# Patient Record
Sex: Female | Born: 1995 | Race: Black or African American | Hispanic: No | Marital: Single | Smoking: Never smoker
Health system: Southern US, Community
[De-identification: ages and names within clinical notes are randomized; demographics above are authoritative.]

## PROBLEM LIST (undated history)

## (undated) DIAGNOSIS — J45909 Unspecified asthma, uncomplicated: Secondary | ICD-10-CM

---

## 2016-02-22 ENCOUNTER — Emergency Department (HOSPITAL_COMMUNITY): Payer: Medicaid Other

## 2016-02-22 ENCOUNTER — Encounter (HOSPITAL_COMMUNITY): Payer: Self-pay | Admitting: Emergency Medicine

## 2016-02-22 ENCOUNTER — Emergency Department (HOSPITAL_COMMUNITY)
Admission: EM | Admit: 2016-02-22 | Discharge: 2016-02-22 | Disposition: A | Discharge status: Home / Self Care | Payer: Medicaid Other | Attending: Emergency Medicine | Admitting: Emergency Medicine

## 2016-02-22 DIAGNOSIS — S93601A Unspecified sprain of right foot, initial encounter: Secondary | ICD-10-CM | POA: Diagnosis not present

## 2016-02-22 DIAGNOSIS — Y999 Unspecified external cause status: Secondary | ICD-10-CM | POA: Diagnosis not present

## 2016-02-22 DIAGNOSIS — Z79899 Other long term (current) drug therapy: Secondary | ICD-10-CM | POA: Insufficient documentation

## 2016-02-22 DIAGNOSIS — Y939 Activity, unspecified: Secondary | ICD-10-CM | POA: Insufficient documentation

## 2016-02-22 DIAGNOSIS — S99921A Unspecified injury of right foot, initial encounter: Secondary | ICD-10-CM | POA: Diagnosis present

## 2016-02-22 DIAGNOSIS — Y929 Unspecified place or not applicable: Secondary | ICD-10-CM | POA: Diagnosis not present

## 2016-02-22 DIAGNOSIS — W010XXA Fall on same level from slipping, tripping and stumbling without subsequent striking against object, initial encounter: Secondary | ICD-10-CM | POA: Diagnosis not present

## 2016-02-22 DIAGNOSIS — J45909 Unspecified asthma, uncomplicated: Secondary | ICD-10-CM | POA: Diagnosis not present

## 2016-02-22 HISTORY — DX: Unspecified asthma, uncomplicated: J45.909

## 2016-02-22 MED ORDER — HYDROCODONE-ACETAMINOPHEN 5-325 MG PO TABS: 1.0000 | ORAL_TABLET | ORAL | 0 refills | Status: DC | PRN

## 2016-02-22 MED ORDER — IBUPROFEN 600 MG PO TABS: 600.0000 mg | ORAL_TABLET | Freq: Four times a day (QID) | ORAL | 0 refills | Status: DC | PRN

## 2016-02-22 MED ORDER — HYDROCODONE-ACETAMINOPHEN 5-325 MG PO TABS
1.0000 | ORAL_TABLET | Freq: Once | ORAL | Status: AC
  Administered 2016-02-22: 1 via ORAL
  Filled 2016-02-22: qty 1

## 2016-02-22 NOTE — ED Provider Notes (Signed)
[facility]-EMERGENCY DEPT Provider Note   CSN: [id number] Arrival date & time: 02/22/16  1014     History   Chief Complaint Chief Complaint  Patient presents with  . Fall  . Ankle Pain    HPI Tanya Rich is a 20 y.o. female.  Pt presents to the ED today with right foot and right ankle pain.  The pt slipped yesterday.  The pt said she was able to walk yesterday, but she was not able to walk today.  The pt called EMS this morning when she could not walk.        Past Medical History:  Diagnosis Date  . Asthma     There are no active problems to display for this patient.   History reviewed. No pertinent surgical history.  OB History    No data available       Home Medications    Prior to Admission medications   Medication Sig Start Date End Date Taking? Authorizing Provider  HYDROcodone-acetaminophen (NORCO/VICODIN) 5-325 MG tablet Take 1 tablet by mouth every 4 (four) hours as needed. 02/22/16   Jacalyn LefevreJulie Kaion Tisdale, MD  ibuprofen (ADVIL,MOTRIN) 600 MG tablet Take 1 tablet (600 mg total) by mouth every 6 (six) hours as needed. 02/22/16   Jacalyn LefevreJulie Shaaron Golliday, MD    Family History History reviewed. No pertinent family history.  Social History Social History  Substance Use Topics  . Smoking status: Never Smoker  . Smokeless tobacco: Not on file  . Alcohol use No     Allergies   Patient has no known allergies.   Review of Systems Review of Systems  Musculoskeletal:       Right foot, ankle, knee pain  All other systems reviewed and are negative.    Physical Exam Updated Vital Signs BP 132/66 (BP Location: Right Arm)   Pulse 92   Temp 98.2 F (36.8 C) (Oral)   Resp 16   LMP 02/08/2016 (Approximate)   SpO2 97%   Physical Exam  Constitutional: She is oriented to person, place, and time. She appears well-developed and well-nourished.  HENT:  Head: Normocephalic and atraumatic.  Right Ear: External ear normal.  Left Ear: External ear normal.  Nose: Nose  normal.  Mouth/Throat: Oropharynx is clear and moist.  Eyes: Conjunctivae and EOM are normal. Pupils are equal, round, and reactive to light.  Neck: Normal range of motion. Neck supple.  Cardiovascular: Normal rate, regular rhythm, normal heart sounds and intact distal pulses.   Pulmonary/Chest: Effort normal and breath sounds normal.  Abdominal: Soft. Bowel sounds are normal.  Musculoskeletal:       Feet:  Neurological: She is alert and oriented to person, place, and time.  Skin: Skin is warm.  Psychiatric: She has a normal mood and affect. Her behavior is normal. Judgment and thought content normal.  Nursing note and vitals reviewed.    ED Treatments / Results  Labs (all labs ordered are listed, but only abnormal results are displayed) Labs Reviewed - No data to display  EKG  EKG Interpretation None       Radiology Dg Ankle Complete Right  Result Date: 02/22/2016 CLINICAL DATA:  20 year old female with foot and ankle swelling for the past 2 months. Symptoms began after she slipped while walking an was recently exacerbated by a new fall last night EXAM: RIGHT ANKLE - COMPLETE 3+ VIEW COMPARISON:  Concurrently obtained radiographs of the right knee and foot FINDINGS: There is no evidence of fracture, dislocation, or joint effusion. There is  no evidence of arthropathy or other focal bone abnormality. Soft tissues are unremarkable. IMPRESSION: Negative. Electronically Signed   By: Malachy MoanHeath  McCullough M.D.   On: 02/22/2016 11:17   Dg Knee Complete 4 Views Right  Result Date: 02/22/2016 CLINICAL DATA:  20 year old female with foot and ankle swelling for the past 2 months. Symptoms began after she slipped while walking an was recently exacerbated by a new fall last night. EXAM: RIGHT KNEE - COMPLETE 4+ VIEW COMPARISON:  Concurrently obtained radiographs of the foot and ankle FINDINGS: No evidence of fracture, dislocation, or joint effusion. No evidence of arthropathy or other focal bone  abnormality. Soft tissues are unremarkable. IMPRESSION: Negative. Electronically Signed   By: Malachy MoanHeath  McCullough M.D.   On: 02/22/2016 11:14   Dg Foot Complete Right  Result Date: 02/22/2016 CLINICAL DATA:  20 year old female with foot and ankle swelling for the past 2 months. Symptoms began after she slipped while walking an was recently exacerbated by a new fall last night EXAM: RIGHT FOOT COMPLETE - 3+ VIEW COMPARISON:  Concurrently obtained radiographs of the ankle and knee FINDINGS: There is no evidence of fracture or dislocation. Small os peroneus noted incidentally. There is no evidence of arthropathy or other focal bone abnormality. Soft tissues are unremarkable. IMPRESSION: Negative. Electronically Signed   By: Malachy MoanHeath  McCullough M.D.   On: 02/22/2016 11:18    Procedures Procedures (including critical care time)  Medications Ordered in ED Medications  HYDROcodone-acetaminophen (NORCO/VICODIN) 5-325 MG per tablet 1 tablet (1 tablet Oral Given 02/22/16 1028)     Initial Impression / Assessment and Plan / ED Course  I have reviewed the triage vital signs and the nursing notes.  Pertinent labs & imaging results that were available during my care of the patient were reviewed by me and considered in my medical decision making (see chart for details).  Clinical Course    Pain has improved.  Pt will be placed in a post-op shoe and crutches.  She is given the number of ortho to f/u.   Final Clinical Impressions(s) / ED Diagnoses   Final diagnoses:  Foot sprain, right, initial encounter    New Prescriptions New Prescriptions   HYDROCODONE-ACETAMINOPHEN (NORCO/VICODIN) 5-325 MG TABLET    Take 1 tablet by mouth every 4 (four) hours as needed.   IBUPROFEN (ADVIL,MOTRIN) 600 MG TABLET    Take 1 tablet (600 mg total) by mouth every 6 (six) hours as needed.     Jacalyn LefevreJulie Keiji Melland, MD 02/22/16 [phone]

## 2016-02-22 NOTE — ED Notes (Signed)
ED Provider at bedside. 

## 2016-02-22 NOTE — ED Notes (Signed)
Patient transported to X-ray 

## 2016-02-22 NOTE — ED Notes (Signed)
Bed: [id number] Expected date:  Expected time:  Means of arrival:  Comments: 20 yo fall

## 2016-02-22 NOTE — ED Triage Notes (Signed)
Pt tripped and fell last night. Began to have R leg pain from knee down to foot since then that was worse upon waking up this am. Swelling noted to R foot. Extremity warm and dry.

## 2016-07-17 ENCOUNTER — Emergency Department (HOSPITAL_COMMUNITY)
Admission: EM | Admit: 2016-07-17 | Discharge: 2016-07-17 | Disposition: A | Discharge status: Home / Self Care | Payer: Medicaid Other | Attending: Emergency Medicine | Admitting: Emergency Medicine

## 2016-07-17 ENCOUNTER — Encounter (HOSPITAL_COMMUNITY): Payer: Self-pay

## 2016-07-17 ENCOUNTER — Emergency Department (HOSPITAL_COMMUNITY): Payer: Medicaid Other

## 2016-07-17 DIAGNOSIS — Y939 Activity, unspecified: Secondary | ICD-10-CM | POA: Diagnosis not present

## 2016-07-17 DIAGNOSIS — W1849XA Other slipping, tripping and stumbling without falling, initial encounter: Secondary | ICD-10-CM | POA: Diagnosis not present

## 2016-07-17 DIAGNOSIS — Z79899 Other long term (current) drug therapy: Secondary | ICD-10-CM | POA: Diagnosis not present

## 2016-07-17 DIAGNOSIS — J45909 Unspecified asthma, uncomplicated: Secondary | ICD-10-CM | POA: Insufficient documentation

## 2016-07-17 DIAGNOSIS — Y929 Unspecified place or not applicable: Secondary | ICD-10-CM | POA: Insufficient documentation

## 2016-07-17 DIAGNOSIS — S8991XA Unspecified injury of right lower leg, initial encounter: Secondary | ICD-10-CM | POA: Diagnosis present

## 2016-07-17 DIAGNOSIS — S8391XA Sprain of unspecified site of right knee, initial encounter: Secondary | ICD-10-CM | POA: Diagnosis not present

## 2016-07-17 DIAGNOSIS — Y999 Unspecified external cause status: Secondary | ICD-10-CM | POA: Diagnosis not present

## 2016-07-17 LAB — POC URINE PREG, ED: Preg Test, Ur: NEGATIVE

## 2016-07-17 NOTE — ED Triage Notes (Signed)
Pt states she sprained her ankle in December. Since then she wonders if she hyperextended her right knee at same time.  Feels popping sensation in knee with pain.  Especially when climbing stairs.  No new injury.

## 2016-07-17 NOTE — ED Provider Notes (Signed)
[facility]-EMERG[facility]Y DEPT Provider Note   CSN: [id number] Arrival date & time: 07/17/16  1225  By signing my name below, I, Majel Homer, attest that this documentation has been prepared under the direction and in the presence of Maxwell Caul, PA-C . Electronically Signed: Majel Homer, Scribe. 07/17/2016. 12:55 PM.  History   Chief Complaint Chief Complaint  Patient presents with  . Knee Pain   The history is provided by the patient. No language interpreter was used.   HPI Comments: Tanya Rich is a 21 y.o. female with PMHx of asthma, who presents to the Emergency Department complaining of intermittent, right knee pain s/p an injury that occurred on 02/22/16. Per chart review, pt visited [facility] ED on 02/22/16 for right ankle pain that occurred after slipping on water in which she received imaging of her right ankle, foot, and knee that revealed no abnormalities. Pt now believes she may have "hyperextended" her right knee during this accident as she has experienced residual pain that is exacerbated when ambulating up stairs. She has been able to ambulate without assistancesince the incident. She notes she was referred to an orthopedic specialist after her initial injury but states she has not followed-up with them for her symptoms. She denies any recent injury, fall, or trauma to her knee, knee swelling, redness, or gait abnormalities.   Past Medical History:  Diagnosis Date  . Asthma    There are no active problems to display for this patient.  History reviewed. No pertinent surgical history.  OB History    No data available     Home Medications    Prior to Admission medications   Medication Sig Start Date End Date Taking? Authorizing Provider  HYDROcodone-acetaminophen (NORCO/VICODIN) 5-325 MG tablet Take 1 tablet by mouth every 4 (four) hours as needed. 02/22/16   Jacalyn Lefevre, MD  ibuprofen (ADVIL,MOTRIN) 600 MG tablet Take 1 tablet (600 mg total) by mouth every 6 (six) hours as needed.  02/22/16   Jacalyn Lefevre, MD   Family History History reviewed. No pertinent family history.  Social History Social History  Substance Use Topics  . Smoking status: Never Smoker  . Smokeless tobacco: Never Used  . Alcohol use No   Allergies   Patient has no known allergies.  Review of Systems Review of Systems  Constitutional: Negative for fever.  Respiratory: Negative for cough and shortness of breath.   Cardiovascular: Negative for chest pain.  Gastrointestinal: Negative for abdominal pain, constipation, diarrhea, nausea and vomiting.  Genitourinary: Negative for dysuria and hematuria.  Musculoskeletal: Positive for arthralgias. Negative for gait problem and joint swelling.  Neurological: Negative for headaches.  All other systems reviewed and are negative.  Physical Exam Updated Vital Signs BP 111/60 (BP Location: Right Arm)   Pulse 80   Temp 98.6 F (37 C) (Oral)   Resp 16   LMP 06/25/2016   SpO2 97%   Physical Exam  Constitutional: She appears well-developed and well-nourished.  HENT:  Head: Normocephalic and atraumatic.  Eyes: Conjunctivae and EOM are normal. Right eye exhibits no discharge. Left eye exhibits no discharge. No scleral icterus.  Pulmonary/Chest: Effort normal.  Musculoskeletal: She exhibits no deformity.  Left knee with no abnormalities.  Tenderness to palpation to the medial aspect of right knee.Good flexion and extension of the right knee. No palpable deformity, no swelling, warmth, or erythema. No tenderness of the lower leg. Negative anterior and posterior drawer test. No instability on valgus or varus stress.  Neurological: She is alert.  Skin: Skin is warm and dry.  Psychiatric: She has a normal mood and affect. Her speech is normal and behavior is normal.   ED Treatments / Results  DIAGNOSTIC STUDIES:  Oxygen Saturation is 97% on RA, normal by my interpretation.    COORDINATION OF CARE:  12:52 PM Discussed treatment plan with pt at  bedside and pt agreed to plan.  Labs (all labs ordered are listed, but only abnormal results are displayed) Labs Reviewed  POC URINE PREG, ED    EKG  EKG Interpretation None       Radiology Dg Knee Complete 4 Views Right  Result Date: 07/17/2016 CLINICAL DATA:  RIGHT knee pain since spraining her ankle in November 2017 EXAM: RIGHT KNEE - COMPLETE 4+ VIEW COMPARISON:  02/22/2016 FINDINGS: Osseous demineralization. Joint spaces preserved. No acute fracture, dislocation, or bone destruction. No knee joint effusion. IMPRESSION: No acute abnormalities. Electronically Signed   By: Ulyses Southward M.D.   On: 07/17/2016 13:23    Procedures Procedures (including critical care time)  Medications Ordered in ED Medications - No data to display  Initial Impression / Assessment and Plan / ED Course  I have reviewed the triage vital signs and the nursing notes.  Pertinent labs & imaging results that were available during my care of the patient were reviewed by me and considered in my medical decision making (see chart for details).      21 yo female who presents with intermittent right knee pain and has been ongoing since December 2017 when she had an ankle sprain. Patient had normal imaging at that time that revealed no fracture or abnormalities. She was referred to an orthopedic doctor but never followed up. She comes to the ED today with residual pain to right knee, worsened by walking. No new injury or trauma. No swelling, warmth, and edema of the knee. Physical exam with good flexion extension of left knee and no instability on anterior or posterior drawer test. Some subjective tenderness to the medial aspect of knee. Consider fracture versus sprain. Will obtain x-ray of right knee to eval for any dislocation or fracture, though low suspicion given history/physical exam. Plan to send home patient with a knee sleeve for stability and support. Instructed patient to follow-up with orthopedic  referral for further imaging or evaluation.  X-ray reviewed. Negative for any acute fracture or dislocation. Symptoms likely a result of a sprain. Discussed plan with patient. Orthopedic referral given and discharge paperwork.  Return precautions discussed. Patient expresses understanding and agreement to plan.     Final Clinical Impressions(s) / ED Diagnoses   Final diagnoses:  Sprain of right knee, unspecified ligament, initial encounter    New Prescriptions New Prescriptions   No medications on file   I personally performed the services described in this documentation, which was scribed in my presence. The recorded information has been reviewed and is accurate.    Maxwell Caul, PA-C 07/17/16 1342    Benjiman Core, MD 07/17/16 1622

## 2016-07-17 NOTE — Discharge Instructions (Signed)
Take Tylenol as directed for pain.  You can use the knee sleeve for stability and support as you walk.  Follow-up with referred orthopedic doctor. Call his office and arrange to be seen next few days.  Return to the emergency department for any worsening pain, weakness, numbness of the legs, difficulty walking.

## 2017-04-21 ENCOUNTER — Other Ambulatory Visit: Payer: Self-pay

## 2017-04-21 ENCOUNTER — Encounter (HOSPITAL_COMMUNITY): Payer: Self-pay | Admitting: Emergency Medicine

## 2017-04-21 ENCOUNTER — Ambulatory Visit (HOSPITAL_COMMUNITY)
Admission: EM | Admit: 2017-04-21 | Discharge: 2017-04-21 | Disposition: A | Discharge status: Home / Self Care | Payer: Medicaid Other | Attending: Family Medicine | Admitting: Family Medicine

## 2017-04-21 DIAGNOSIS — Z113 Encounter for screening for infections with a predominantly sexual mode of transmission: Secondary | ICD-10-CM | POA: Diagnosis present

## 2017-04-21 DIAGNOSIS — J45909 Unspecified asthma, uncomplicated: Secondary | ICD-10-CM | POA: Insufficient documentation

## 2017-04-21 NOTE — ED Triage Notes (Signed)
[facility]. The patient denied any symptoms or none exposure.

## 2017-04-21 NOTE — Discharge Instructions (Signed)
We have sent testing for sexually transmitted infections. We will notify you of any positive results once they are received. If required, we will prescribe any medications you might need. °

## 2017-04-21 NOTE — ED Notes (Signed)
Dirty urine collected. 

## 2017-04-22 LAB — URINE CYTOLOGY ANCILLARY ONLY
CHLAMYDIA, DNA PROBE: NEGATIVE
NEISSERIA GONORRHEA: NEGATIVE
Trichomonas: NEGATIVE

## 2017-04-26 LAB — URINE CYTOLOGY ANCILLARY ONLY: Candida vaginitis: NEGATIVE

## 2017-04-26 NOTE — ED Provider Notes (Signed)
  [facility]   [id number] 04/21/17 Arrival Time: 1923  ASSESSMENT & PLAN:  1. Screening for STD (sexually transmitted disease)    Pending: Labs Reviewed  URINE CYTOLOGY ANCILLARY ONLY   No empiric treatment. Will notify of any positive results and treatment or follow up needed.  Reviewed expectations re: course of current medical issues. Questions answered. Outlined signs and symptoms indicating need for more acute intervention. Patient verbalized understanding. After Visit Summary given.   SUBJECTIVE:  Liliana ClineShamia Platten is a 22 y.o. female who presents requesting screening for STIs. Without symptoms. Afebrile. No abdominal or pelvic pain. No GU discharge. No n/v. No rashes or lesions. Sexually active with single female partner.  Patient's last menstrual period was 04/21/2017 (exact date).  ROS: As per HPI.  OBJECTIVE:  Vitals:   04/21/17 1955  BP: 131/79  Pulse: 71  Resp: 16  Temp: 98.1 F (36.7 C)  TempSrc: Oral  SpO2: 99%    General appearance: alert, cooperative, appears stated age and no distress Throat: lips, mucosa, and tongue normal; teeth and gums normal Abdomen: soft, non-tender GU: declines Skin: warm and dry Psychological:  Alert and cooperative. Normal mood and affect.   Labs Reviewed  URINE CYTOLOGY ANCILLARY ONLY    No Known Allergies  Past Medical History:  Diagnosis Date  . Asthma    History reviewed. No pertinent family history. Social History   Socioeconomic History  . Marital status: Single    Spouse name: Not on file  . Number of children: Not on file  . Years of education: Not on file  . Highest education level: Not on file  Social Needs  . Financial resource strain: Not on file  . Food insecurity - worry: Not on file  . Food insecurity - inability: Not on file  . Transportation needs - medical: Not on file  . Transportation needs - non-medical: Not on file  Occupational History  . Not on file  Tobacco Use  .  Smoking status: Never Smoker  . Smokeless tobacco: Never Used  Substance and Sexual Activity  . Alcohol use: No  . Drug use: Not on file  . Sexual activity: Not on file  Other Topics Concern  . Not on file  Social History Narrative  . Not on file          Mardella LaymanHagler, [place], MD 04/26/17 [phone]

## 2017-04-29 ENCOUNTER — Telehealth (HOSPITAL_COMMUNITY): Payer: Self-pay | Admitting: Emergency Medicine

## 2017-04-29 MED ORDER — METRONIDAZOLE 500 MG PO TABS: 500.0000 mg | ORAL_TABLET | Freq: Two times a day (BID) | ORAL | 0 refills | Status: AC

## 2017-04-29 NOTE — Telephone Encounter (Signed)
-----   Message from Isa RankinLaura Wilson Murray, MD sent at 04/27/2017  4:12 PM EST ----- Clinical staff, please let patient know that test for gardnerella (bacterial vaginosis) was positive.  This only needs to be treated if there are persistent symptoms, such as vaginal irritation/discharge.   If these symptoms are present, ok to send rx for metronidazole 500mg  bid x 7d #14 no refills or metronidazole vaginal gel 0.75% 1 applicatorful bid x 7d #14 no refills.  Recheck or followup with PCP for further evaluation if symptoms are not improving.  LM

## 2017-04-29 NOTE — Telephone Encounter (Signed)
Called pt to notify of recent lab results.... Pt ID'd properly Reports persistent vag d/c [facility] [address] [place].. Rx sent Adv pt if sx are not getting better to return or to f/u w/PCP Education on safe sex given Notified pt that lab results can be obtained through MyChart Pt verb understanding.

## 2017-07-31 ENCOUNTER — Emergency Department (HOSPITAL_COMMUNITY)
Admission: EM | Admit: 2017-07-31 | Discharge: 2017-07-31 | Disposition: A | Discharge status: Home / Self Care | Payer: Medicaid Other | Attending: Emergency Medicine | Admitting: Emergency Medicine

## 2017-07-31 ENCOUNTER — Emergency Department (HOSPITAL_COMMUNITY): Payer: Medicaid Other

## 2017-07-31 ENCOUNTER — Encounter (HOSPITAL_COMMUNITY): Payer: Self-pay

## 2017-07-31 ENCOUNTER — Other Ambulatory Visit: Payer: Self-pay

## 2017-07-31 DIAGNOSIS — Z79899 Other long term (current) drug therapy: Secondary | ICD-10-CM | POA: Insufficient documentation

## 2017-07-31 DIAGNOSIS — J45909 Unspecified asthma, uncomplicated: Secondary | ICD-10-CM | POA: Insufficient documentation

## 2017-07-31 DIAGNOSIS — M25572 Pain in left ankle and joints of left foot: Secondary | ICD-10-CM | POA: Insufficient documentation

## 2017-07-31 MED ORDER — IBUPROFEN 600 MG PO TABS: 600.0000 mg | ORAL_TABLET | Freq: Four times a day (QID) | ORAL | 0 refills | Status: DC | PRN

## 2017-07-31 MED ORDER — IBUPROFEN 800 MG PO TABS
800.0000 mg | ORAL_TABLET | Freq: Once | ORAL | Status: AC
  Administered 2017-07-31: 800 mg via ORAL
  Filled 2017-07-31: qty 1

## 2017-07-31 NOTE — ED Notes (Signed)
Verbalized understanding discharge instructions, prescriptions, and follow-up. In no acute distress.   

## 2017-07-31 NOTE — Discharge Instructions (Signed)
Use the crutches as needed until your pain improves.  Wear the left ankle brace when you are putting weight on your left foot.  Apply ice for 15 to 20 minutes up to 3-4 times per day.  Elevate your left leg above the level of your heart when you are sitting and resting.  Take 600 mg of ibuprofen with food once every 6 hours for pain or 650 mg of Tylenol every 6 hours for pain.  If your symptoms do not start to improve within the next week, please follow-up with orthopedics.  Their information is listed above.  If you develop new or worsening symptoms including if you have any fall or injury, new numbness or weakness, please return to the emergency department for reevaluation.

## 2017-07-31 NOTE — ED Provider Notes (Signed)
[facility]-EMERGENCY DEPT Provider Note   CSN: [id number] Arrival date & time: 07/31/17  1726     History   Chief Complaint Chief Complaint  Patient presents with  . Ankle Pain    HPI Tanya Rich is a 22 y.o. female resents to the emergency department with a chief complaint of left ankle pain.  The patient reports that her job requires her to stand on her feet and walk for long periods of time.  She reports sudden onset, gradually worsening left ankle pain over the last 3 to 4 days.  She denies twisting her ankle, any known trauma, falls, or other injuries.  No numbness or weakness.  She has been ambulatory since onset of symptoms, but has had to limp due to the pain.  No history of left ankle or foot injury or surgery.  No treatment prior to arrival.  The history is provided by the patient. No language interpreter was used.  Ankle Pain   Pertinent negatives include no numbness.    Past Medical History:  Diagnosis Date  . Asthma     There are no active problems to display for this patient.   History reviewed. No pertinent surgical history.   OB History   None      Home Medications    Prior to Admission medications   Medication Sig Start Date End Date Taking? Authorizing Provider  metroNIDAZOLE (FLAGYL) 500 MG tablet Take 1 tablet (500 mg total) by mouth 2 (two) times daily. 04/29/17   Isa Rankin, MD    Family History No family history on file.  Social History Social History   Tobacco Use  . Smoking status: Never Smoker  . Smokeless tobacco: Never Used  Substance Use Topics  . Alcohol use: No  . Drug use: Never     Allergies   Patient has no known allergies.   Review of Systems Review of Systems  Constitutional: Negative for activity change.  Respiratory: Negative for shortness of breath.   Cardiovascular: Negative for chest pain.  Gastrointestinal: Negative for abdominal pain.  Musculoskeletal: Positive for  arthralgias and myalgias. Negative for back pain.  Skin: Negative for rash.  Allergic/Immunologic: Negative for immunocompromised state.  Neurological: Negative for weakness and numbness.     Physical Exam Updated Vital Signs BP 126/71 (BP Location: Left Arm)   Pulse 88   Temp 98.4 F (36.9 C) (Oral)   Resp 18   Ht  (1.626 m)   Wt 95.3 kg (210 lb)   LMP 07/29/2017   SpO2 100%   BMI 36.05 kg/m   Physical Exam  Constitutional: No distress.  HENT:  Head: Normocephalic.  Eyes: Conjunctivae are normal.  Neck: Neck supple.  Cardiovascular: Normal rate and regular rhythm. Exam reveals no gallop and no friction rub.  No murmur heard. Pulmonary/Chest: Effort normal. No respiratory distress.  Abdominal: Soft. She exhibits no distension.  Musculoskeletal: She exhibits tenderness. She exhibits no edema or deformity.  Full active and passive range of motion of the left ankle and knee.  Pain with range of motion of the left ankle.  Sensation is intact throughout.  DP and PT pulses are 2+ and symmetric.  5 out of 5 strength against resistance with dorsiflexion plantar flexion.  No overlying edema, erythema, or warmth.  She is tender to palpation over the  anterior ankle.  No lateral or medial malleolus tenderness.  No tenderness to the Achilles tendon.  Able to bear weight on the bilateral  lower extremities.  Antalgic gait.  Neurological: She is alert.  Skin: Skin is warm. No rash noted.  Psychiatric: Her behavior is normal.  Nursing note and vitals reviewed.    ED Treatments / Results  Labs (all labs ordered are listed, but only abnormal results are displayed) Labs Reviewed - No data to display  EKG None  Radiology Dg Ankle Complete Left  Result Date: 07/31/2017 CLINICAL DATA:  Ankle pain for several days, no known injury, initial encounter EXAM: LEFT ANKLE COMPLETE - 3+ VIEW COMPARISON:  None. FINDINGS: No acute fracture or dislocation is seen. Mild soft tissue swelling  is noted. Mild tarsal degenerative changes are seen. IMPRESSION: Mild soft tissue swelling without bony abnormality. Electronically Signed   By: Alcide Clever M.D.   On: 07/31/2017 19:36    Procedures Procedures (including critical care time)  Medications Ordered in ED Medications  ibuprofen (ADVIL,MOTRIN) tablet 800 mg (has no administration in time range)     Initial Impression / Assessment and Plan / ED Course  I have reviewed the triage vital signs and the nursing notes.  Pertinent labs & imaging results that were available during my care of the patient were reviewed by me and considered in my medical decision making (see chart for details).     23 year old female with no pertinent past medical history presenting with atraumatic left ankle pain. Patient X-Ray negative for obvious fracture or dislocation. Pain managed in ED. Pt advised to follow up with orthopedics if symptoms persist for possibility of missed fracture diagnosis.  Ibuprofen given for pain.  Patient given brace and crutches while in ED, conservative therapy recommended and discussed. Patient will be dc home & is agreeable with above plan.  Final Clinical Impressions(s) / ED Diagnoses   Final diagnoses:  Acute left ankle pain    ED Discharge Orders    None       Barkley Boards, PA-C 07/31/17 2237    Nira Conn, MD 08/01/17 0011

## 2017-07-31 NOTE — ED Triage Notes (Signed)
Left ankle pain. Pt states that she works on her feet at [employer] and was wearing uncomfortable shoes. Pt states she bought new shoes and is still having pain. Pt states she is wearing an extra pair of socks d/t pain. No injury/trauma

## 2017-11-19 IMAGING — CR DG KNEE COMPLETE 4+V*R*
4 series · 4 of 4 positions shown · non-contrast
Comparison: Concurrently obtained radiographs of the foot and ankle

CLINICAL DATA: 20-year-old female with foot and ankle swelling for
the past 2 months. Symptoms began after she slipped while walking an
was recently exacerbated by a new fall last night.

EXAM:
RIGHT KNEE - COMPLETE 4+ VIEW

[x knee ap right (1 of 4)]
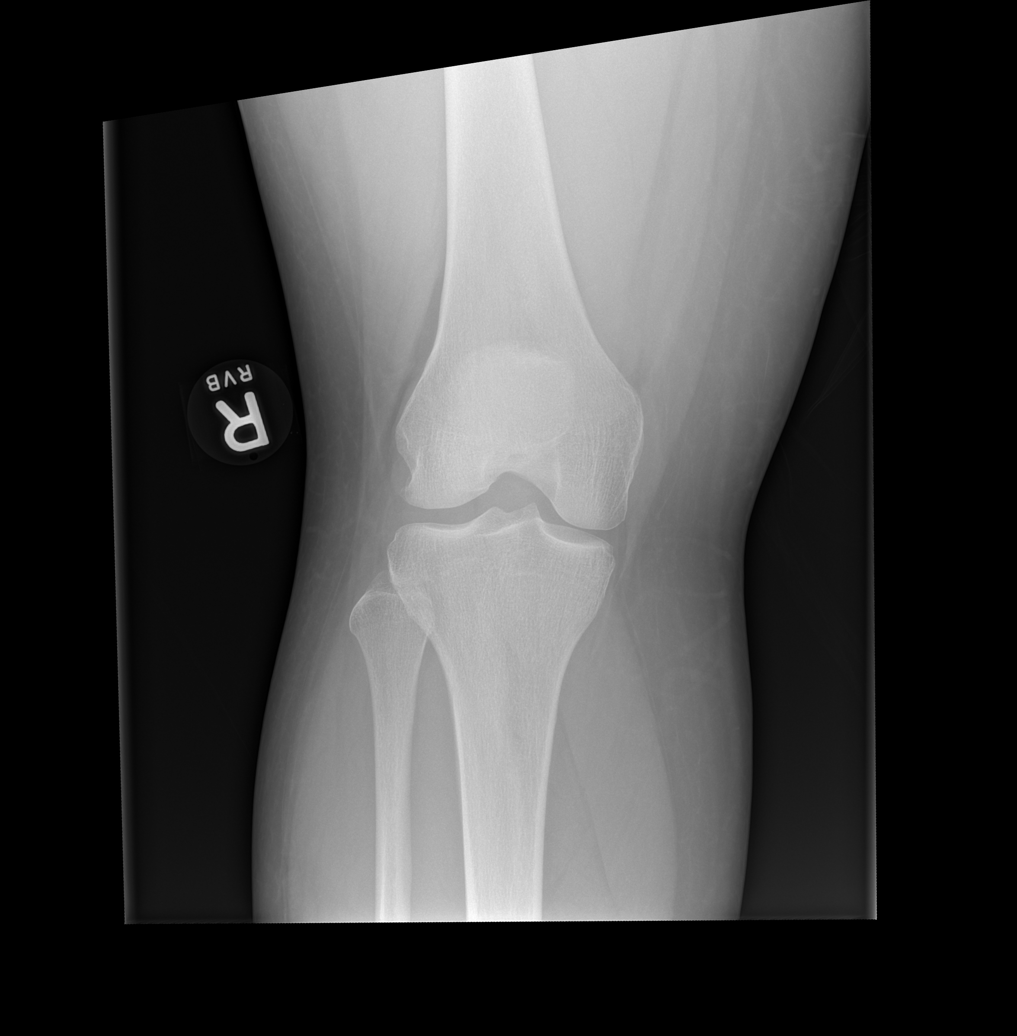

[x knee ap right (2 of 4)]
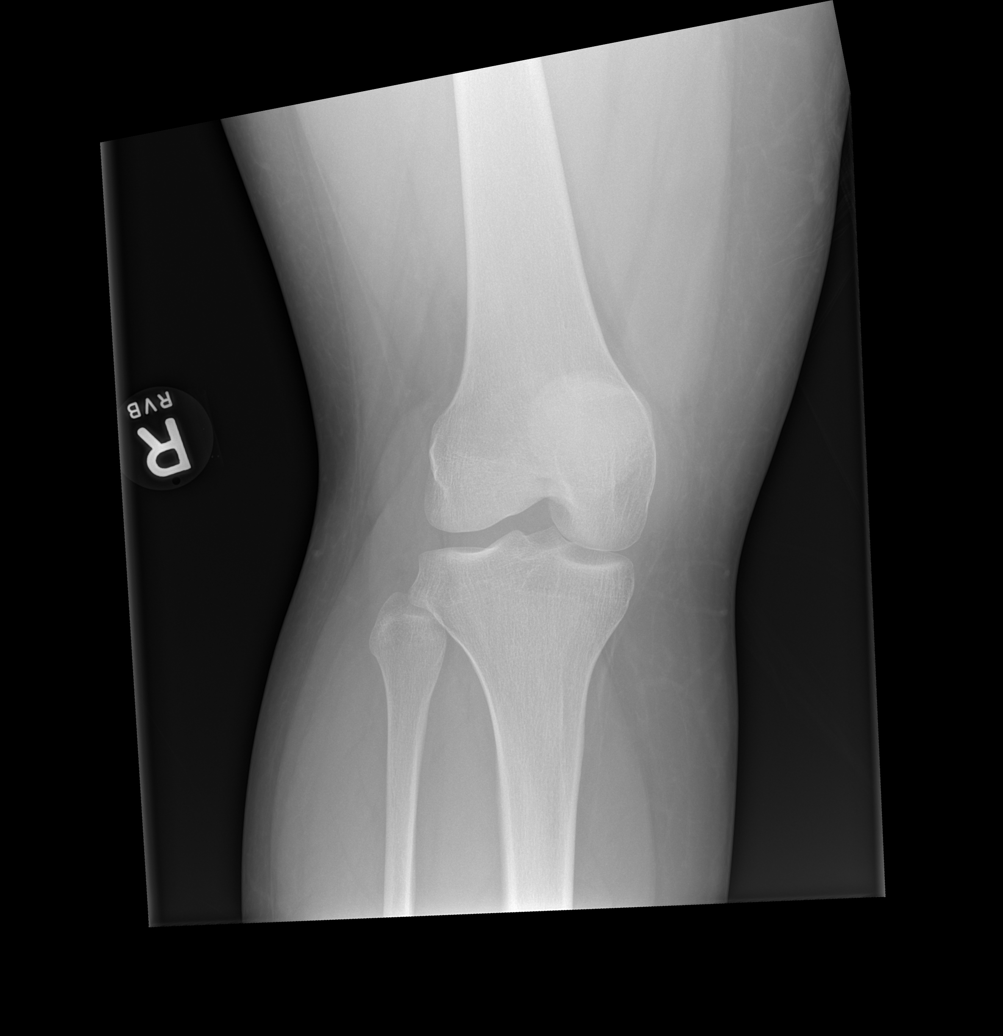

[x knee ap right (3 of 4)]
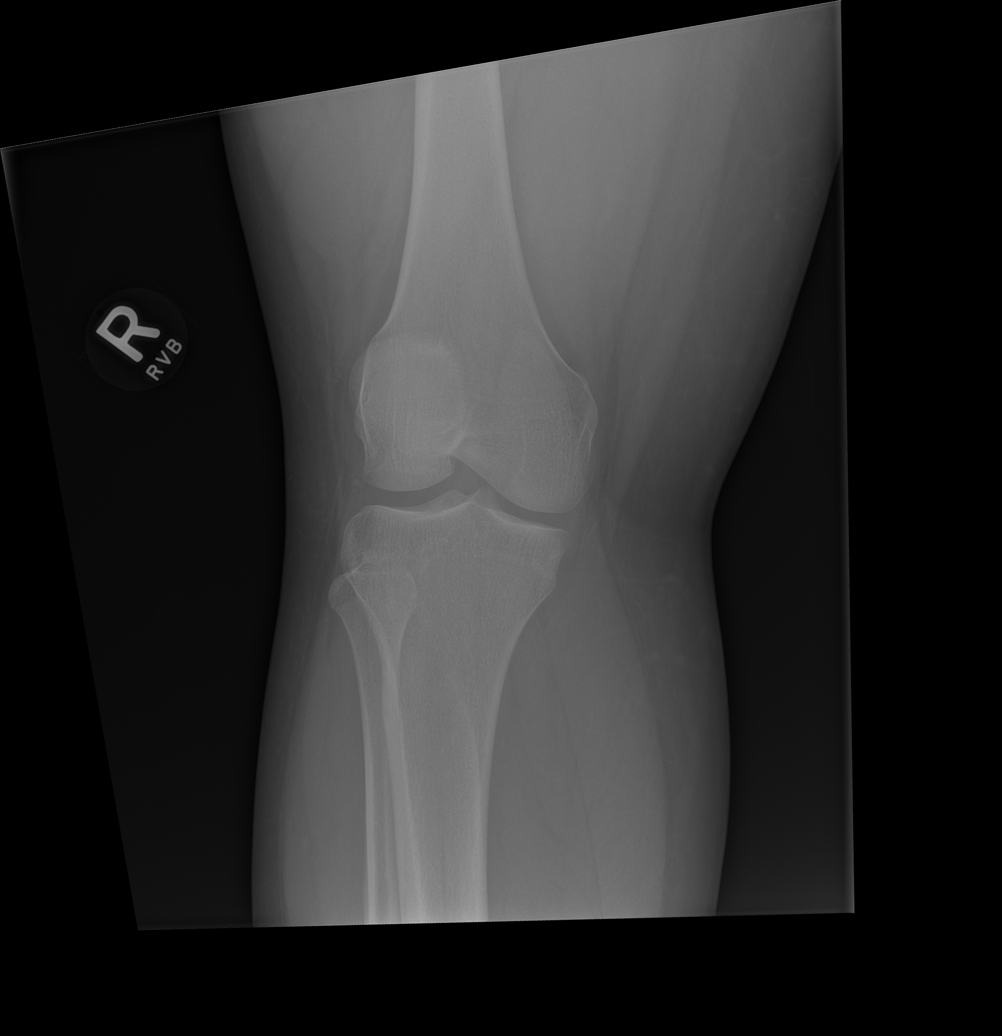

[x knee ap right (4 of 4)]
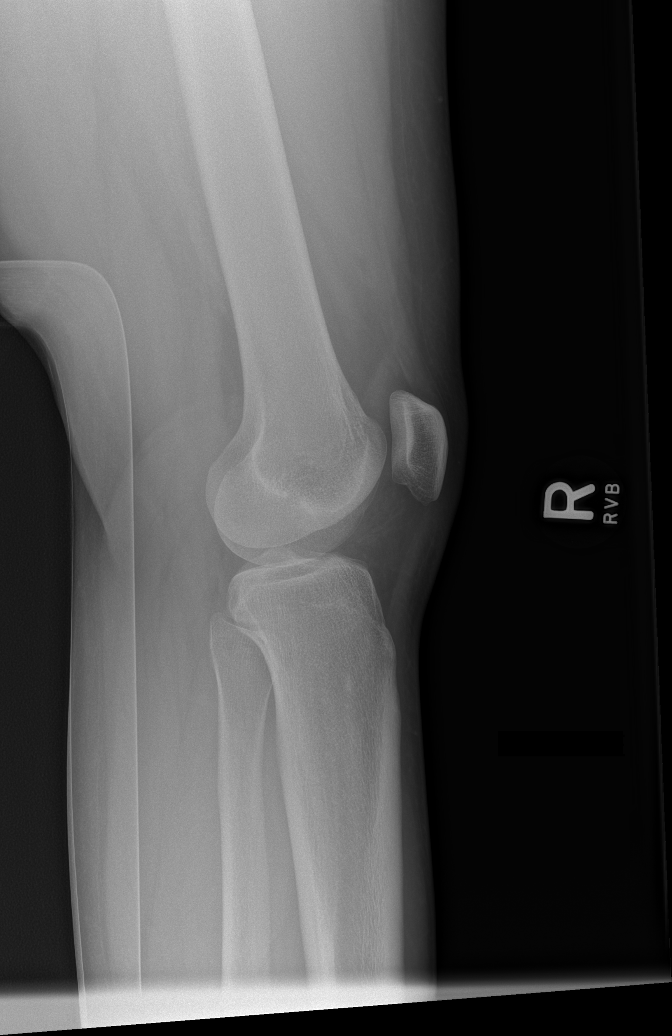

[4 of 4 positions shown; findings below may reference images not displayed]

FINDINGS: No evidence of fracture, dislocation, or joint effusion. No evidence
of arthropathy or other focal bone abnormality. Soft tissues are
unremarkable.
IMPRESSION: Negative.

## 2019-04-05 ENCOUNTER — Emergency Department (HOSPITAL_COMMUNITY)
Admission: EM | Admit: 2019-04-05 | Discharge: 2019-04-05 | Disposition: A | Discharge status: Home / Self Care | Payer: Medicaid Other | Attending: Emergency Medicine | Admitting: Emergency Medicine

## 2019-04-05 DIAGNOSIS — J45909 Unspecified asthma, uncomplicated: Secondary | ICD-10-CM | POA: Insufficient documentation

## 2019-04-05 DIAGNOSIS — S66912A Strain of unspecified muscle, fascia and tendon at wrist and hand level, left hand, initial encounter: Secondary | ICD-10-CM

## 2019-04-05 DIAGNOSIS — S66812A Strain of other specified muscles, fascia and tendons at wrist and hand level, left hand, initial encounter: Secondary | ICD-10-CM | POA: Insufficient documentation

## 2019-04-05 DIAGNOSIS — Y93I9 Activity, other involving external motion: Secondary | ICD-10-CM | POA: Insufficient documentation

## 2019-04-05 DIAGNOSIS — Y999 Unspecified external cause status: Secondary | ICD-10-CM | POA: Insufficient documentation

## 2019-04-05 DIAGNOSIS — Y92414 Local residential or business street as the place of occurrence of the external cause: Secondary | ICD-10-CM | POA: Insufficient documentation

## 2019-04-05 MED ORDER — IBUPROFEN 600 MG PO TABS
600.0000 mg | ORAL_TABLET | Freq: Four times a day (QID) | ORAL | 0 refills | Status: AC | PRN
Start: 2019-04-05 — End: ?

## 2019-04-05 MED ORDER — CYCLOBENZAPRINE HCL 10 MG PO TABS: 10.0000 mg | ORAL_TABLET | Freq: Two times a day (BID) | ORAL | 0 refills | Status: AC | PRN
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
## 2019-04-05 NOTE — ED Notes (Signed)
Pt verbalizes understanding of DC instructions. Pt belongings returned and is ambulatory out of ED.  
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
## 2019-04-28 IMAGING — CR DG ANKLE COMPLETE 3+V*L*
3 series · 3 of 3 positions shown · non-contrast
Comparison: None.

CLINICAL DATA: Ankle pain for several days, no known injury,
initial encounter

EXAM:
LEFT ANKLE COMPLETE - 3+ VIEW

[x ankle ap left]
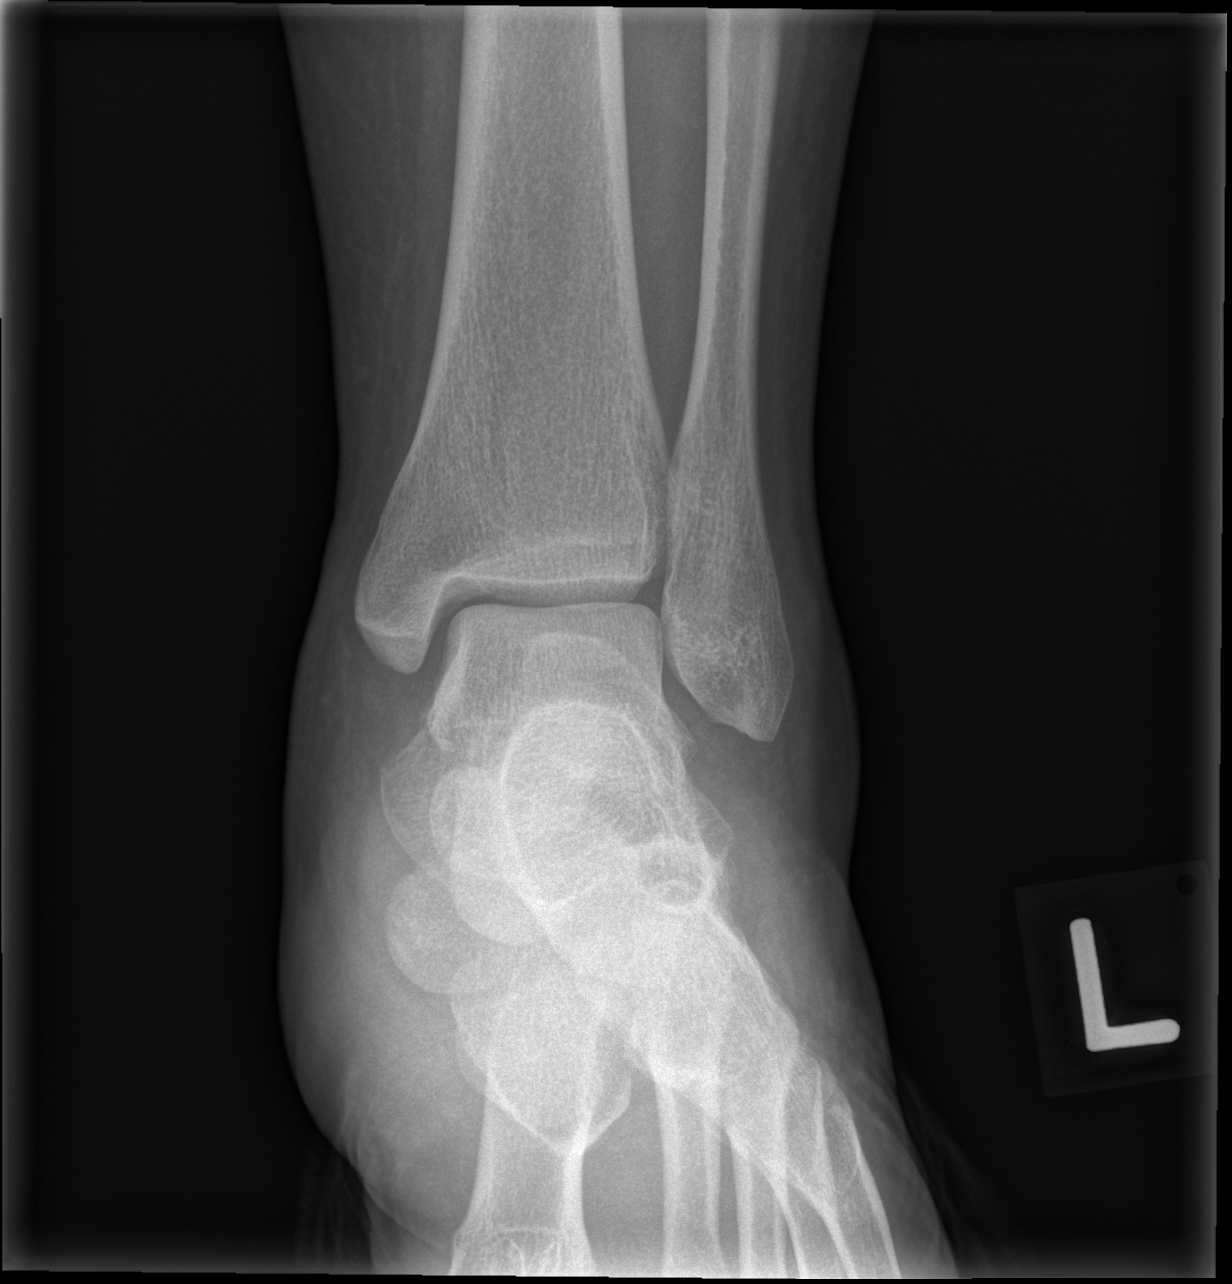

[x ankle obl left]
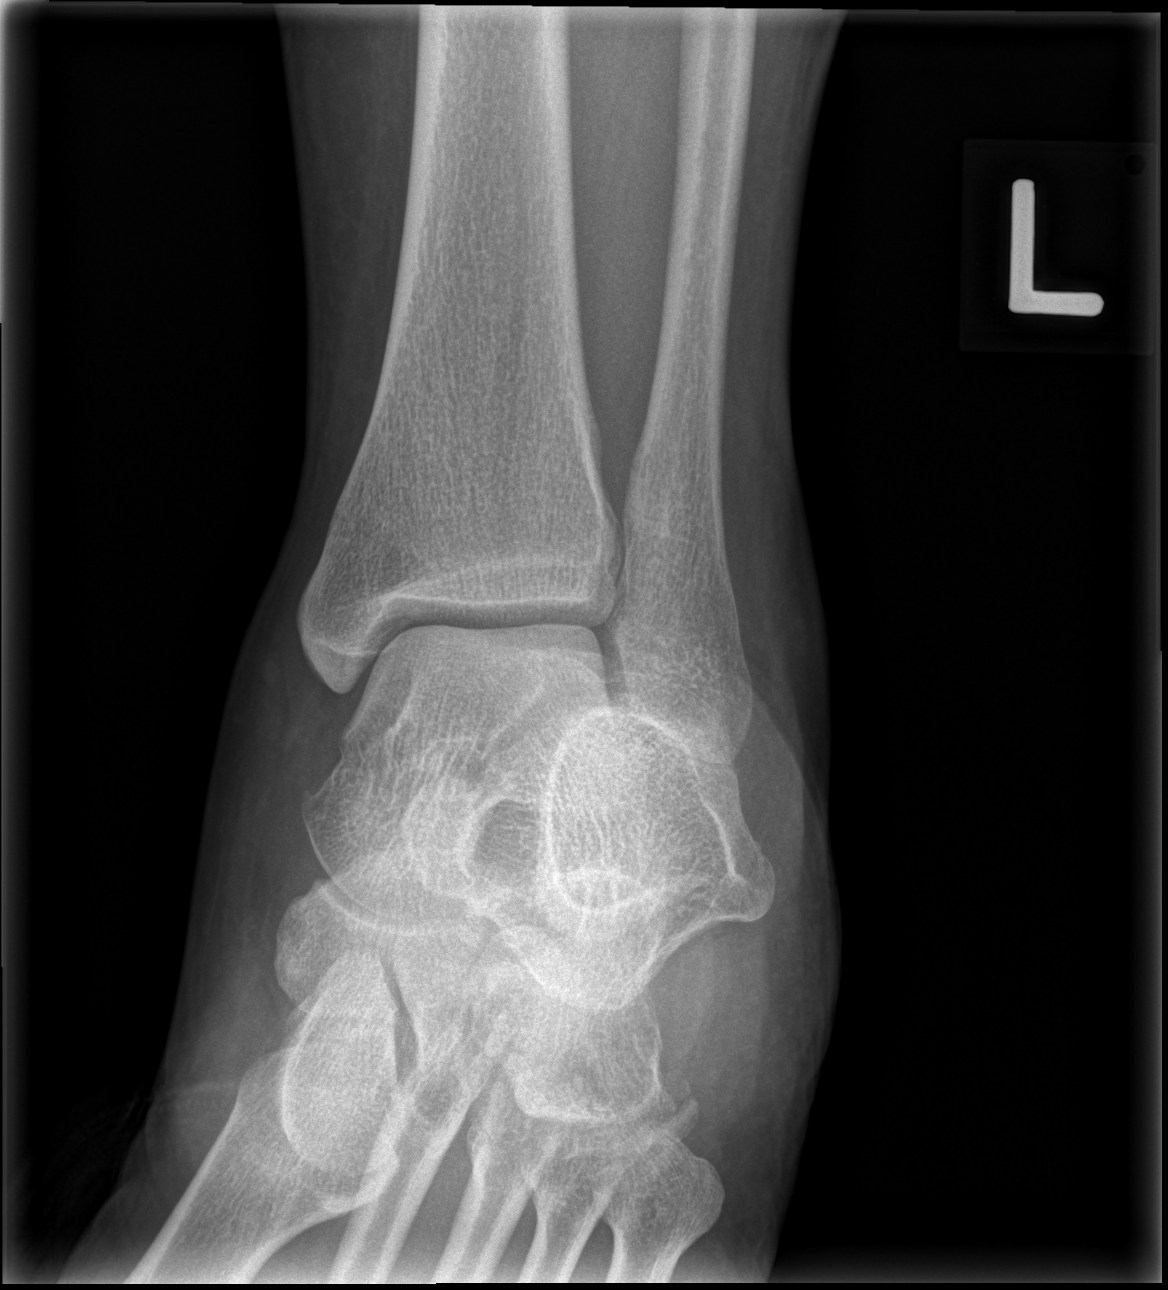

[x ankle lat left]
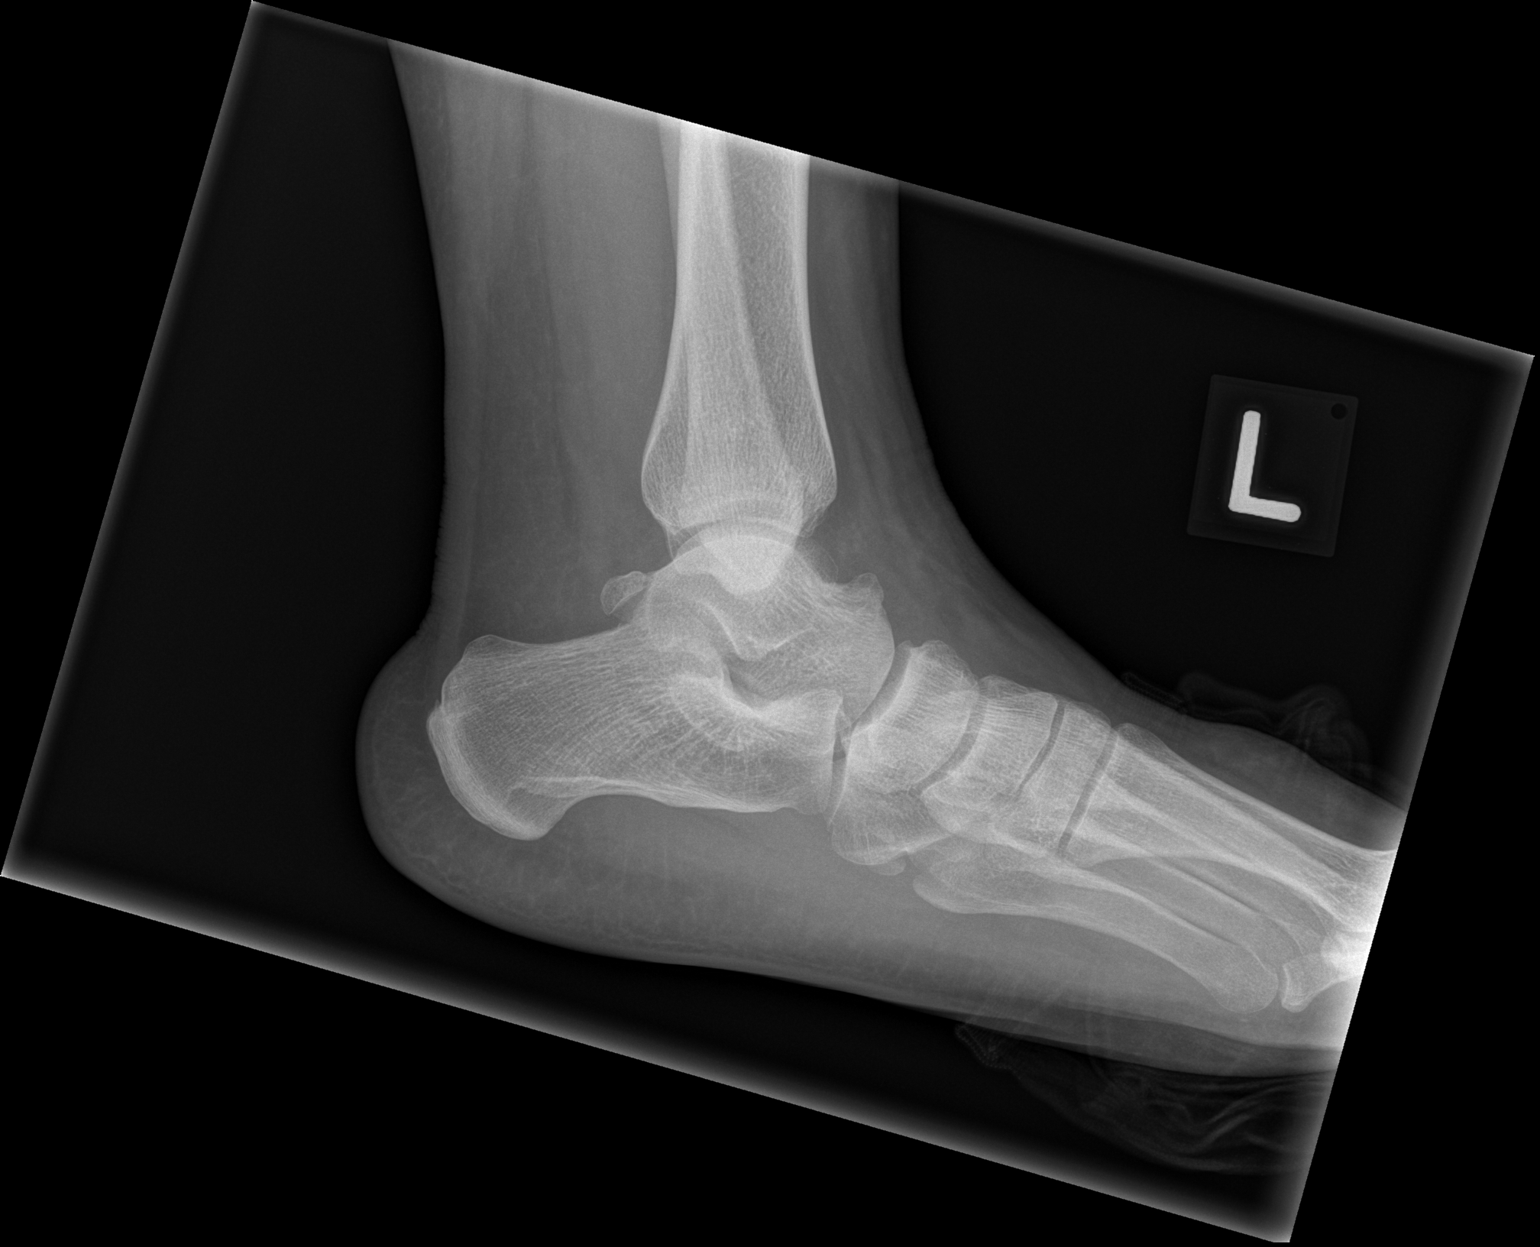

[3 of 3 positions shown; findings below may reference images not displayed]

FINDINGS: No acute fracture or dislocation is seen. Mild soft tissue swelling
is noted. Mild tarsal degenerative changes are seen.
IMPRESSION: Mild soft tissue swelling without bony abnormality.
